# Patient Record
Sex: Male | Born: 1965 | Race: White | Hispanic: No | Marital: Married | Smoking: Former smoker
Health system: Southern US, Community
[De-identification: ages and names within clinical notes are randomized; demographics above are authoritative.]

## PROBLEM LIST (undated history)

## (undated) DIAGNOSIS — I10 Essential (primary) hypertension: Secondary | ICD-10-CM

## (undated) DIAGNOSIS — K219 Gastro-esophageal reflux disease without esophagitis: Secondary | ICD-10-CM

## (undated) DIAGNOSIS — E785 Hyperlipidemia, unspecified: Secondary | ICD-10-CM

## (undated) DIAGNOSIS — E119 Type 2 diabetes mellitus without complications: Secondary | ICD-10-CM

---

## 2008-09-14 HISTORY — PX: GASTRIC BYPASS: SHX52

## 2022-02-02 ENCOUNTER — Emergency Department (HOSPITAL_BASED_OUTPATIENT_CLINIC_OR_DEPARTMENT_OTHER): Admitting: Radiology

## 2022-02-02 ENCOUNTER — Encounter (HOSPITAL_BASED_OUTPATIENT_CLINIC_OR_DEPARTMENT_OTHER): Payer: Self-pay

## 2022-02-02 ENCOUNTER — Emergency Department (HOSPITAL_BASED_OUTPATIENT_CLINIC_OR_DEPARTMENT_OTHER)
Admission: EM | Admit: 2022-02-02 | Discharge: 2022-02-02 | Disposition: A | Discharge status: Home / Self Care | Attending: Emergency Medicine | Admitting: Emergency Medicine

## 2022-02-02 ENCOUNTER — Other Ambulatory Visit: Payer: Self-pay

## 2022-02-02 DIAGNOSIS — R0782 Intercostal pain: Secondary | ICD-10-CM | POA: Insufficient documentation

## 2022-02-02 DIAGNOSIS — R0781 Pleurodynia: Secondary | ICD-10-CM

## 2022-02-02 HISTORY — DX: Essential (primary) hypertension: I10

## 2022-02-02 HISTORY — DX: Gastro-esophageal reflux disease without esophagitis: K21.9

## 2022-02-02 HISTORY — DX: Type 2 diabetes mellitus without complications: E11.9

## 2022-02-02 HISTORY — DX: Hyperlipidemia, unspecified: E78.5

## 2022-02-02 NOTE — ED Provider Notes (Signed)
[facility] EMERGENCY DEPT Provider Note   CSN: [id number] Arrival date & time: 02/02/22  1332     History  Chief Complaint  Patient presents with   Kyle Hayes is a 56 y.o. male.  Patient presents ER chief complaint of left lateral rib pain.  He states that he fell approximately 4 feet while on a ladder about 10 days ago.  He said persistent pain on the left side.  This pain was worsened with increased activity in the last several days.  Denies any fevers or cough or vomiting or diarrhea.  Pain is made worse with specific movements he states.      Home Medications Prior to Admission medications   Not on File      Allergies    Codeine and Morphine    Review of Systems   Review of Systems  Constitutional:  Negative for fever.  HENT:  Negative for ear pain and sore throat.   Eyes:  Negative for pain.  Respiratory:  Negative for cough.   Cardiovascular:  Negative for chest pain.  Gastrointestinal:  Negative for abdominal pain.  Genitourinary:  Positive for flank pain.  Musculoskeletal:  Negative for back pain.  Skin:  Negative for color change and rash.  Neurological:  Negative for syncope.  All other systems reviewed and are negative.  Physical Exam Updated Vital Signs BP 140/89 (BP Location: Right Arm)   Pulse 88   Temp 98.7 F (37.1 C) (Oral)   Resp 20   Ht 5\' 8"  (1.727 m)   Wt 127 kg   SpO2 97%   BMI 42.57 kg/m  Physical Exam Constitutional:      Appearance: He is well-developed.  HENT:     Head: Normocephalic.     Nose: Nose normal.  Eyes:     Extraocular Movements: Extraocular movements intact.  Cardiovascular:     Rate and Rhythm: Normal rate.  Pulmonary:     Effort: Pulmonary effort is normal.  Abdominal:     Tenderness: There is no abdominal tenderness. There is no guarding or rebound.  Musculoskeletal:     Comments: Pain reproduced with twisting of his torso to the left side.  Otherwise no focal neurodeficit noted.   Skin:    Coloration: Skin is not jaundiced.  Neurological:     General: No focal deficit present.     Mental Status: He is alert. Mental status is at baseline.    ED Results / Procedures / Treatments   Labs (all labs ordered are listed, but only abnormal results are displayed) Labs Reviewed - No data to display  EKG None  Radiology DG Ribs Unilateral W/Chest Left  Result Date: 02/02/2022 CLINICAL DATA:  Left rib pain after a fall 2 weeks ago EXAM: LEFT RIBS AND CHEST - 3+ VIEW COMPARISON:  None Available. FINDINGS: Frontal view of the chest and three views of left-sided ribs. Images are degraded by patient body habitus. Frontal view of the chest demonstrates mild cardiomegaly. No pneumothorax. Extremely low lung volumes. Patchy left base airspace disease. No right and no definite left pleural effusion. Rib radiographs demonstrate a marker projecting at approximately the lateral left eighth rib. No underlying displaced fracture. IMPRESSION: Patient body habitus limitations as detailed above. No displaced rib fracture, pneumothorax, or significant pleural fluid. Left base airspace disease, favoring atelectasis. Cardiomegaly and low lung volumes. Electronically Signed   By: Jeronimo Greaves M.D.   On: 02/02/2022 14:54    Procedures Procedures  Medications Ordered in ED Medications - No data to display  ED Course/ Medical Decision Making/ A&P                           Medical Decision Making Amount and/or Complexity of Data Reviewed Radiology: ordered.   Chart review shows office visit for hyperlipidemia November 14, 2021.  Work-up here today includes x-rays of the ribs which were unremarkable for displaced fracture.  Difficult imaging per radiologist due to body habitus.  Differential included abdominal pathology, however he has no abdominal tenderness on exam no guarding or rebound noted.  Patient advised continue Tylenol Motrin as needed for pain.  Recommended outpatient follow-up  with his doctor within the week if pain persist.  Recommending he return back to the ER if he has difficulty breathing worsening symptoms or any additional concerns.        Final Clinical Impression(s) / ED Diagnoses Final diagnoses:  Rib pain on left side    Rx / DC Orders ED Discharge Orders     None         Luna Fuse, MD 02/02/22 1506

## 2022-02-02 NOTE — ED Triage Notes (Signed)
Patient here POV from Home.  Endorses falling on 01/22/2022 approximately 4 Feet onto Left Back/Torso. On 01/25/2022, he also endorses worsening Pain due to Sexual Activity.   No LOC. No Anticoagulants.  NAD Noted during Triage. A&Ox4. GCS 15. Ambulatory.

## 2022-02-02 NOTE — ED Notes (Addendum)
Fell on 01/22/22 approx 4 ft hitting back and left side of body. Pain 3/10 Alert and oriented NAD, normal gait

## 2022-02-02 NOTE — Discharge Instructions (Signed)
There is no evidence of a displaced fracture on x-ray.  This may mean that you contused your ribs, or had a hairline fracture or a fracture that is already started to heal.  Follow-up with your doctor this week.  Take Tylenol and Motrin as needed for pain.  Return back to the ER if you have worsening symptoms or any additional concerns.

## 2022-08-24 NOTE — Progress Notes (Signed)
Cardiology Office Note:    Date:  08/26/2022   ID:  Kyle Hayes, DOB 15-May-1966, MRN [id number]  PCP:  Rebecka Apley, NP  Cardiologist:  Jodelle Red, MD  Referring MD: Rebecka Apley, NP   CC: New patient evaluation for chest pain  History of Present Illness:    Kyle Hayes is a 56 y.o. male with a hx of hypertension, hyperlipidemia, GERD, type 2 diabetes, iron deficiency anemia, s/p Roux-en-Y gastric bypass (2010), depression, and morbid obesity, who is seen as a new consult at the request of Hemberg, Ruby Cola, NP for the evaluation and management of chest pain.  On 07/20/2022 he saw Sharon Seller, NP and reported left chest pain ongoing for about 7 months. Not worse with exertion, and not affected by aspirin. His EKG showed NSR and was similar to prior in 2021.  Previously had a fall from a ladder in 01/2022 and presented to the ED with left lateral costal pain. X-ray imaging of the ribs unremarkable for displaced fracture.   Chest pain: -Initial onset: Since his teens, he describes having a "stress spot" in his left chest that he can point to. His chest discomfort was always localized to this spot until more recently, as the area is now broader but still concentrated in the "stress spot." -Quality: Usually more of a discomfort than a pain. Not changed with applying pressure. A couple times he was close to presenting to the ED due to the severity. -Frequency: Typically dependent on stress. This year has been more frequent since his wife's heart attack.  -Duration: On Friday a few weeks ago he had a more severe episode of chest pain. Today he had "just a twinge" while out in the lobby, and some discomfort during the visit today which he attributes to the stressful discussions.  -Aggravating/alleviating factors: Stress. Feels worse when stress is worse and more frequent. He uses meditation to consciously try to relax. -Prior workup:  He was previously seen by [facility]  cardiology 10/2019 for chest pain. Stress test 2021 was negative for ischemia. Previous sleep study with no indication of sleep apnea. -Alcohol: Very rare.  -Tobacco: Former smoker when he was in [employer], about 1 ppd. He quit in 2010. -Comorbidities: Hyperlipidemia on 40 mg simvastatin for 20+ years. Previously he tried rosuvastatin but felt like he had the flu, so he returned to simvastatin. Hypertension on losartan/HCTZ. Diabetes. He endorses hypoglycemia; if doesn't eat well he will feel worse. Rare acid reflux at the end of the day. -Exercise level: Able to mow his yard and go walking daily. Chest discomfort at times, but still has been able to continue with activity. He completed a strenuous hike the other day at [facility] with no exacerbation of chest pain. Used to be greater than 400 lbs, then dropped to 200 lbs and developed an issue with absorption. He then suffered chronic headaches and diarrhea over years. He doubled up on anti-allergens and symptoms resolved. He states that he takes 16x the recommended amount of anti-allergens to maintain (takes medications 4 times a day on average). Currently he states his weight has stabilized, and he plans to reach a goal of 225-250 lbs. -Cardiac ROS: no shortness of breath, no PND, no orthopnea, no LE edema, no syncope -Family history:  Father and paternal grandmother died in their 104's. His father had CABG and other heart issues. Paternal uncle with carotid disease. His mother had myocarditis. He has siblings who have hypertension, hyperlipidemia, and diabetes.  Past Medical History:  Diagnosis Date   Diabetes (HCC)    GERD (gastroesophageal reflux disease)    Hyperlipidemia    Hypertension     Past Surgical History:  Procedure Laterality Date   GASTRIC BYPASS  2010    Current Medications: Current Outpatient Medications on File Prior to Visit  Medication Sig   acetaminophen (TYLENOL) 500 MG tablet Take 500 mg by mouth every 6 (six) hours  as needed for mild pain or moderate pain.   busPIRone (BUSPAR) 15 MG tablet Take 15 mg by mouth 2 (two) times daily.   cetirizine (ZYRTEC) 10 MG tablet Take 10 mg by mouth daily.   Cholecalciferol 125 MCG (5000 UT) TABS Take 2 tablets by mouth daily.   doxepin (SINEQUAN) 50 MG capsule Take 50-100 mg by mouth at bedtime.   EPINEPHrine 0.3 mg/0.3 mL IJ SOAJ injection Inject 0.3 mg into the muscle as needed.   ferrous sulfate 325 (65 FE) MG EC tablet Take 325 mg by mouth daily with breakfast.   FLUoxetine (PROZAC) 20 MG capsule Take 20 mg by mouth 2 (two) times daily.   fluticasone (FLONASE) 50 MCG/ACT nasal spray Place 1 spray into both nostrils daily.   losartan-hydrochlorothiazide (HYZAAR) 100-25 MG tablet Take 1 tablet by mouth daily.   metFORMIN (GLUCOPHAGE-XR) 500 MG 24 hr tablet Take 500 mg by mouth daily with breakfast.   olopatadine (PATANOL) 0.1 % ophthalmic solution Place 1 drop into both eyes as needed.   Omega-3 Fatty Acids (FISH OIL) 1000 MG CAPS Take 1 capsule by mouth 2 (two) times daily.   omeprazole (PRILOSEC) 20 MG capsule Take 20 mg by mouth 2 (two) times daily.   simvastatin (ZOCOR) 40 MG tablet Take 40 mg by mouth daily at 6 PM.   traZODone (DESYREL) 100 MG tablet Take 100 mg by mouth at bedtime. For sleep   No current facility-administered medications on file prior to visit.     Allergies:   Rosuvastatin, Codeine, and Morphine   Social History   Tobacco Use   Smoking status: Former    Types: Cigarettes   Smokeless tobacco: Never  Substance Use Topics   Alcohol use: Yes    Comment: Occasionally   Drug use: Never    Family History: Father and paternal grandmother died in their 94's. His father had CABG and other heart issues. Paternal uncle with carotid disease. His mother had myocarditis. He has siblings who have hypertension, hyperlipidemia, and diabetes.  ROS:   Please see the history of present illness.  Additional pertinent ROS: Constitutional: Negative  for chills, fever, night sweats, unintentional weight loss  HENT: Negative for ear pain and hearing loss.   Eyes: Negative for loss of vision and eye pain.  Respiratory: Negative for cough, sputum, wheezing.   Cardiovascular: See HPI. Gastrointestinal: Negative for abdominal pain, melena, and hematochezia.  Genitourinary: Negative for dysuria and hematuria.  Musculoskeletal: Negative for falls and myalgias.  Skin: Negative for itching. Positive for intermittent skin rash.  Neurological: Negative for focal weakness, focal sensory changes and loss of consciousness. Positive for stress. Endo/Heme/Allergies: Does not bruise/bleed easily.     EKGs/Labs/Other Studies Reviewed:    The following studies were reviewed today:  Stress Echo 11/27/2019  ([facility]): Left Ventricle: Systolic function is normal. EF: 60-65%.    Post-stress: Left ventricle cavity appears normal post-stress. Echo  contrast used to augment images.    Post-stress Impression: The study is negative for ischemia.   Patient achieved 10.10 METs.   EKG:  EKG is personally reviewed.   08/25/2022: nsr at 75 bpm  Recent Labs: No results found for requested labs within last 365 days.   Recent Lipid Panel No results found for: "CHOL", "TRIG", "HDL", "CHOLHDL", "VLDL", "LDLCALC", "LDLDIRECT"  Physical Exam:    VS:  BP 114/82 (BP Location: Right Arm, Patient Position: Sitting, Cuff Size: Large)   Pulse 75   Ht  (1.727 m)   Wt 273 lb 14.4 oz (124.2 kg)   BMI 41.65 kg/m     Wt Readings from Last 3 Encounters:  08/25/22 273 lb 14.4 oz (124.2 kg)  02/02/22 280 lb (127 kg)    GEN: Well nourished, well developed in no acute distress HEENT: Normal, moist mucous membranes NECK: No JVD CARDIAC: regular rhythm, normal S1 and S2, no rubs or gallops. No murmur. VASCULAR: Radial and DP pulses 2+ bilaterally. No carotid bruits RESPIRATORY:  Clear to auscultation without rales, wheezing or rhonchi  ABDOMEN: Soft, non-tender,  non-distended MUSCULOSKELETAL:  Ambulates independently SKIN: Warm and dry, no edema NEUROLOGIC:  Alert and oriented x 3. No focal neuro deficits noted. PSYCHIATRIC:  Normal affect    ASSESSMENT:    1. Chest pain of uncertain etiology   2. Essential hypertension   3. Mixed hyperlipidemia   4. Type 2 diabetes mellitus without complication, without long-term current use of insulin (HCC)   5. Cardiac risk counseling   6. Counseling on health promotion and disease prevention   7. History of Roux-en-Y gastric bypass   8. Precordial pain   9. Class 3 severe obesity due to excess calories with serious comorbidity and body mass index (BMI) of 40.0 to 44.9 in adult [facility])    PLAN:    Chest pain -discussed options today. Had normal stress echo in the past. As this has been largely atypical but going on for a long time, discussed cardiac PET for best resolution capability for evaluation of ischemia -reviewed red flag warning signs that need immediate medical attention  Hypertension -continue losartan-HCTZ  Mixed hyperlipidemia Type II diabetes Class 3 obesity History of gastric bypass surgery -on metformin. Discussed GLP1RA today, he has used in the past, didn't feel that it helped much. Would consider trying another agent such as Mounjaro but defer to his PCP. He is working with Ginette Otto weight loss, declines glp1 -on simvastatin. If cardiac PET suggests CAD, would intensify statin Lipids 07/17/22 Tchol 186, HDL 37, LDL 112, TG 209  Cardiac risk counseling and prevention recommendations: -recommend heart healthy/Mediterranean diet, with whole grains, fruits, vegetable, fish, lean meats, nuts, and olive oil. Limit salt. -recommend moderate walking, 3-5 times/week for 30-50 minutes each session. Aim for at least 150 minutes.week. Goal should be pace of 3 miles/hours, or walking 1.5 miles in 30 minutes -recommend avoidance of tobacco products. Avoid excess alcohol. -ASCVD risk score: The  10-year ASCVD risk score (Arnett DK, et al., 2019) is: 12%   Values used to calculate the score:     Age: 62 years     Sex: Male     Is Non-Hispanic African American: No     Diabetic: Yes     Tobacco smoker: No     Systolic Blood Pressure: 114 mmHg     Is BP treated: Yes     HDL Cholesterol: 37 mg/dL     Total Cholesterol: 186 mg/dL    Plan for follow up: 3 months, or sooner as needed.  Jodelle Red, MD, PhD, [facility]  [facility]  Medication Adjustments/Labs and Tests Ordered: Current medicines are reviewed at length with the patient today.  Concerns regarding medicines are outlined above.   Orders Placed This Encounter  Procedures   NM PET CT CARDIAC PERFUSION MULTI W/ABSOLUTE BLOODFLOW   Cardiac Stress Test: Informed Consent Details: Physician/Practitioner Attestation; Transcribe to consent form and obtain patient signature   EKG 12-Lead   No orders of the defined types were placed in this encounter.  Patient Instructions  Medication Instructions:  Your Physician recommend you continue on your current medication as directed.    *If you need a refill on your cardiac medications before your next appointment, please call your pharmacy*  Testing/Procedures: How to Prepare for Your Cardiac PET/CT Stress Test:  1. Please do not take these medications before your test:   Medications that may interfere with the cardiac pharmacological stress agent (ex. nitrates - including erectile dysfunction medications, isosorbide mononitrate or beta-blockers) the day of the exam. (Erectile dysfunction medication should be held for at least 72 hrs prior to test) Theophylline containing medications for 12 hours. Dipyridamole 48 hours prior to the test. Your remaining medications may be taken with water.  2. Nothing to eat or drink, except water, 3 hours prior to arrival time.   NO caffeine/decaffeinated products, or chocolate 12 hours prior to arrival.  3. NO  perfume, cologne or lotion  4. Total time is 1 to 2 hours; you may want to bring reading material for the waiting time.  5. [facility] 30 minutes early for your test.  [address]  [place], [place] [id number]  Diabetic Preparation:  Hold oral medications. You may take NPH and Lantus insulin. Do not take Humalog or Humulin R (Regular Insulin) the day of your test. Check blood sugars prior to leaving the house. If able to eat breakfast prior to 3 hour fasting, you may take all medications, including your insulin, Do not worry if you miss your breakfast dose of insulin - start at your next meal.  In preparation for your appointment, medication and supplies will be purchased.  Appointment availability is limited, so if you need to cancel or reschedule, please call the Radiology Department at [phone]  24 hours in advance to avoid a cancellation fee of $100.00  What to Expect After you Arrive:  Once you arrive and check in for your appointment, you will be taken to a preparation room within the Radiology Department.  A technologist or Nurse will obtain your medical history, verify that you are correctly prepped for the exam, and explain the procedure.  Afterwards,  an IV will be started in your arm and electrodes will be placed on your skin for EKG monitoring during the stress portion of the exam. Then you will be escorted to the PET/CT scanner.  There, staff will get you positioned on the scanner and obtain a blood pressure and EKG.  During the exam, you will continue to be connected to the EKG and blood pressure machines.  A small, safe amount of a radioactive tracer will be injected in your IV to obtain a series of pictures of your heart along with an injection of a stress agent.    After your Exam:  It is recommended that you eat a meal and drink a caffeinated beverage to counter act any effects of the stress agent.  Drink  plenty of fluids for the remainder of the day and urinate frequently for the first couple  of hours after the exam.  Your doctor will inform you of your test results within 7-10 business days.  For questions about your test or how to prepare for your test, please call: Rockwell Alexandria, Cardiac Imaging Nurse Navigator  Larey Brick, Cardiac Imaging Nurse Navigator Office: [phone]    Follow-Up: At [facility], you and your health needs are our priority.  As part of our continuing mission to provide you with exceptional heart care, we have created designated Provider Care Teams.  These Care Teams include your primary Cardiologist (physician) and Advanced Practice Providers (APPs -  Physician Assistants and Nurse Practitioners) who all work together to provide you with the care you need, when you need it.  We recommend signing up for the patient portal called "MyChart".  Sign up information is provided on this After Visit Summary.  MyChart is used to connect with patients for Virtual Visits (Telemedicine).  Patients are able to view lab/test results, encounter notes, upcoming appointments, etc.  Non-urgent messages can be sent to your provider as well.   To learn more about what you can do with MyChart, go to [email or web address].    Your next appointment:   3 month(s)  The format for your next appointment:   In Person  Provider:   Jodelle Red, MD       [facility] Stumpf,acting as a scribe for Jodelle Red, MD.,have documented all relevant documentation on the behalf of Jodelle Red, MD,as directed by  Jodelle Red, MD while in the presence of Jodelle Red, MD.  I, Jodelle Red, MD, have reviewed all documentation for this visit. The documentation on 08/26/22 for the exam, diagnosis, procedures, and orders are all accurate and complete.   Signed, Jodelle Red, MD PhD 08/26/2022 4:56 PM    [facility]

## 2022-08-25 ENCOUNTER — Ambulatory Visit (INDEPENDENT_AMBULATORY_CARE_PROVIDER_SITE_OTHER): Admitting: Cardiology

## 2022-08-25 ENCOUNTER — Encounter (HOSPITAL_BASED_OUTPATIENT_CLINIC_OR_DEPARTMENT_OTHER): Payer: Self-pay | Admitting: Cardiology

## 2022-08-25 VITALS — BP 114/82 | HR 75 | Ht 68.0 in | Wt 273.9 lb

## 2022-08-25 DIAGNOSIS — Z7189 Other specified counseling: Secondary | ICD-10-CM

## 2022-08-25 DIAGNOSIS — R072 Precordial pain: Secondary | ICD-10-CM | POA: Diagnosis not present

## 2022-08-25 DIAGNOSIS — E782 Mixed hyperlipidemia: Secondary | ICD-10-CM

## 2022-08-25 DIAGNOSIS — E119 Type 2 diabetes mellitus without complications: Secondary | ICD-10-CM | POA: Diagnosis not present

## 2022-08-25 DIAGNOSIS — I10 Essential (primary) hypertension: Secondary | ICD-10-CM

## 2022-08-25 DIAGNOSIS — Z9884 Bariatric surgery status: Secondary | ICD-10-CM

## 2022-08-25 DIAGNOSIS — E66813 Obesity, class 3: Secondary | ICD-10-CM

## 2022-08-25 DIAGNOSIS — Z6841 Body Mass Index (BMI) 40.0 and over, adult: Secondary | ICD-10-CM

## 2022-08-25 DIAGNOSIS — R079 Chest pain, unspecified: Secondary | ICD-10-CM

## 2022-08-25 NOTE — Patient Instructions (Signed)
Medication Instructions:  Your Physician recommend you continue on your current medication as directed.    *If you need a refill on your cardiac medications before your next appointment, please call your pharmacy*  Testing/Procedures: How to Prepare for Your Cardiac PET/CT Stress Test:  1. Please do not take these medications before your test:   Medications that may interfere with the cardiac pharmacological stress agent (ex. nitrates - including erectile dysfunction medications, isosorbide mononitrate or beta-blockers) the day of the exam. (Erectile dysfunction medication should be held for at least 72 hrs prior to test) Theophylline containing medications for 12 hours. Dipyridamole 48 hours prior to the test. Your remaining medications may be taken with water.  2. Nothing to eat or drink, except water, 3 hours prior to arrival time.   NO caffeine/decaffeinated products, or chocolate 12 hours prior to arrival.  3. NO perfume, cologne or lotion  4. Total time is 1 to 2 hours; you may want to bring reading material for the waiting time.  5. Please report to Admitting at the [facility] Main Entrance 30 minutes early for your test.  [address] [place], [place] [address]  Diabetic Preparation:  Hold oral medications. You may take NPH and Lantus insulin. Do not take Humalog or Humulin R (Regular Insulin) the day of your test. Check blood sugars prior to leaving the house. If able to eat breakfast prior to 3 hour fasting, you may take all medications, including your insulin, Do not worry if you miss your breakfast dose of insulin - start at your next meal.  In preparation for your appointment, medication and supplies will be purchased.  Appointment availability is limited, so if you need to cancel or reschedule, please call the Radiology Department at [phone]  24 hours in advance to avoid a cancellation fee of $100.00  What to Expect After you Arrive:  Once you  arrive and check in for your appointment, you will be taken to a preparation room within the Radiology Department.  A technologist or Nurse will obtain your medical history, verify that you are correctly prepped for the exam, and explain the procedure.  Afterwards,  an IV will be started in your arm and electrodes will be placed on your skin for EKG monitoring during the stress portion of the exam. Then you will be escorted to the PET/CT scanner.  There, staff will get you positioned on the scanner and obtain a blood pressure and EKG.  During the exam, you will continue to be connected to the EKG and blood pressure machines.  A small, safe amount of a radioactive tracer will be injected in your IV to obtain a series of pictures of your heart along with an injection of a stress agent.    After your Exam:  It is recommended that you eat a meal and drink a caffeinated beverage to counter act any effects of the stress agent.  Drink plenty of fluids for the remainder of the day and urinate frequently for the first couple of hours after the exam.  Your doctor will inform you of your test results within 7-10 business days.  For questions about your test or how to prepare for your test, please call: Rockwell Alexandria, Cardiac Imaging Nurse Navigator  Larey Brick, Cardiac Imaging Nurse Navigator Office: [phone]    Follow-Up: At [facility], you and your health needs are our priority.  As part of our continuing mission to provide you with exceptional heart care, we  have created designated Provider Care Teams.  These Care Teams include your primary Cardiologist (physician) and Advanced Practice Providers (APPs -  Physician Assistants and Nurse Practitioners) who all work together to provide you with the care you need, when you need it.  We recommend signing up for the patient portal called "MyChart".  Sign up information is provided on this After Visit Summary.  MyChart is used to connect with  patients for Virtual Visits (Telemedicine).  Patients are able to view lab/test results, encounter notes, upcoming appointments, etc.  Non-urgent messages can be sent to your provider as well.   To learn more about what you can do with MyChart, go to [email or web address].    Your next appointment:   3 month(s)  The format for your next appointment:   In Person  Provider:   Jodelle Red, MD

## 2022-08-26 ENCOUNTER — Encounter (HOSPITAL_BASED_OUTPATIENT_CLINIC_OR_DEPARTMENT_OTHER): Payer: Self-pay | Admitting: Cardiology

## 2022-09-25 ENCOUNTER — Telehealth (HOSPITAL_COMMUNITY): Payer: Self-pay | Admitting: *Deleted

## 2022-09-25 NOTE — Telephone Encounter (Signed)
Reaching out to patient to offer assistance regarding upcoming cardiac imaging study; pt verbalizes understanding of appt date/time, parking situation and where to check in, pre-test NPO status and verified current allergies; name and call back number provided for further questions should they arise  Gordy Clement RN Navigator Cardiac Imaging [facility] [phone] office [phone] cell  Patient aware to avoid caffeine 12 hours prior to his cardiac PET scan.

## 2022-09-29 ENCOUNTER — Encounter (HOSPITAL_COMMUNITY)
Admission: RE | Admit: 2022-09-29 | Discharge: 2022-09-29 | Disposition: A | Discharge status: Home / Self Care | Source: Ambulatory Visit | Attending: Cardiology | Admitting: Cardiology

## 2022-09-29 DIAGNOSIS — R072 Precordial pain: Secondary | ICD-10-CM | POA: Diagnosis present

## 2022-09-29 LAB — NM PET CT CARDIAC PERFUSION MULTI W/ABSOLUTE BLOODFLOW
MBFR: 4.43
Nuc Rest EF: 59 %
Nuc Stress EF: 69 %
Rest MBF: 0.6 ml/g/min
ST Depression (mm): 0 mm
Stress MBF: 2.66 ml/g/min
TID: 1.06

## 2022-09-29 MED ORDER — RUBIDIUM RB82 GENERATOR (RUBYFILL)
30.0000 | PACK | Freq: Once | INTRAVENOUS | Status: AC
  Administered 2022-09-29: 30.1 via INTRAVENOUS

## 2022-09-29 MED ORDER — REGADENOSON 0.4 MG/5ML IV SOLN
INTRAVENOUS | Status: AC
  Filled 2022-09-29: qty 5

## 2022-09-29 MED ORDER — REGADENOSON 0.4 MG/5ML IV SOLN
0.4000 mg | Freq: Once | INTRAVENOUS | Status: AC
  Administered 2022-09-29: 0.4 mg via INTRAVENOUS

## 2022-09-29 MED ORDER — RUBIDIUM RB82 GENERATOR (RUBYFILL)
30.0000 | PACK | Freq: Once | INTRAVENOUS | Status: AC
  Administered 2022-09-29: 30.2 via INTRAVENOUS

## 2022-11-24 ENCOUNTER — Encounter (HOSPITAL_BASED_OUTPATIENT_CLINIC_OR_DEPARTMENT_OTHER): Payer: Self-pay | Admitting: Cardiology

## 2022-11-24 ENCOUNTER — Ambulatory Visit (HOSPITAL_BASED_OUTPATIENT_CLINIC_OR_DEPARTMENT_OTHER): Admitting: Cardiology

## 2022-11-24 VITALS — BP 124/76 | HR 56 | Ht 68.0 in | Wt 280.0 lb

## 2022-11-24 DIAGNOSIS — Z712 Person consulting for explanation of examination or test findings: Secondary | ICD-10-CM

## 2022-11-24 DIAGNOSIS — Z6841 Body Mass Index (BMI) 40.0 and over, adult: Secondary | ICD-10-CM

## 2022-11-24 DIAGNOSIS — I2510 Atherosclerotic heart disease of native coronary artery without angina pectoris: Secondary | ICD-10-CM | POA: Diagnosis not present

## 2022-11-24 DIAGNOSIS — E782 Mixed hyperlipidemia: Secondary | ICD-10-CM | POA: Diagnosis not present

## 2022-11-24 DIAGNOSIS — Z7189 Other specified counseling: Secondary | ICD-10-CM

## 2022-11-24 DIAGNOSIS — E119 Type 2 diabetes mellitus without complications: Secondary | ICD-10-CM | POA: Diagnosis not present

## 2022-11-24 DIAGNOSIS — Z9884 Bariatric surgery status: Secondary | ICD-10-CM

## 2022-11-24 DIAGNOSIS — I10 Essential (primary) hypertension: Secondary | ICD-10-CM | POA: Diagnosis not present

## 2022-11-24 MED ORDER — ROSUVASTATIN CALCIUM 20 MG PO TABS: 20.0000 mg | ORAL_TABLET | Freq: Every day | ORAL | 3 refills | Status: DC

## 2022-11-24 NOTE — Patient Instructions (Signed)
Medication Instructions:  Change to Rosuvastatin (Crestor)  *If you need a refill on your cardiac medications before your next appointment, please call your pharmacy*   Lab Work: Lipids and Liver Function studies in 2 to 3 months  If you have labs (blood work) drawn today and your tests are completely normal, you will receive your results only by: [place] (if you have MyChart) OR A paper copy in the mail If you have any lab test that is abnormal or we need to change your treatment, we will call you to review the results.   Testing/Procedures: None   Follow-Up: At [facility], you and your health needs are our priority.  As part of our continuing mission to provide you with exceptional heart care, we have created designated Provider Care Teams.  These Care Teams include your primary Cardiologist (physician) and Advanced Practice Providers (APPs -  Physician Assistants and Nurse Practitioners) who all work together to provide you with the care you need, when you need it.  We recommend signing up for the patient portal called "MyChart".  Sign up information is provided on this After Visit Summary.  MyChart is used to connect with patients for Virtual Visits (Telemedicine).  Patients are able to view lab/test results, encounter notes, upcoming appointments, etc.  Non-urgent messages can be sent to your provider as well.   To learn more about what you can do with MyChart, go to [email or web address].    Your next appointment:   6 month(s)  Provider:   Buford Dresser, MD    Other Instructions None

## 2022-11-24 NOTE — Progress Notes (Signed)
Cardiology Office Note:    Date:  11/24/2022   ID:  Kyle Hayes, DOB 07-May-1966, MRN [id number]  PCP:  Bridget Hartshorn, NP  Cardiologist:  Buford Dresser, MD  Referring MD: Bridget Hartshorn, NP   CC: follow up  History of Present Illness:    Kyle Hayes is a 57 y.o. male with a hx of hypertension, hyperlipidemia, GERD, type 2 diabetes, iron deficiency anemia, s/p Roux-en-Y gastric bypass (2010), depression, and morbid obesity, who is seen for follow up today. I initially met him 08/25/2022 as a new consult at the request of Hemberg, Karie Schwalbe, NP for the evaluation and management of chest pain.  Tobacco: Former smoker when he was in [employer], about 1 ppd. He quit in 2010. Comorbidities: Hyperlipidemia on 40 mg simvastatin for 20+ years. Previously he tried rosuvastatin but felt like he had the flu, so he returned to simvastatin. Hypertension on losartan/HCTZ. Diabetes. He endorses hypoglycemia; if doesn't eat well he will feel worse. Rare acid reflux at the end of the day.  Today, the patient states that he has been doing well. He had tried rosuvastatin prior and was unable to tolerate it. We discussed using atorvastatin instead. His blood pressure initially in clinic was 144/70, on recheck it was 124/76.  He has been attempting to improve his diet recently and has also started supplementing for weight loss. He has been struggling with his mentality regarding weight loss after regaining 30 lbs he had lost earlier last year.  He denies any palpitations, chest pain, shortness of breath, or peripheral edema. No lightheadedness, headaches, syncope, orthopnea, or PND.  Past Medical History:  Diagnosis Date   Diabetes ([place])    GERD (gastroesophageal reflux disease)    Hyperlipidemia    Hypertension     Past Surgical History:  Procedure Laterality Date   GASTRIC BYPASS  2010    Current Medications: Current Outpatient Medications on File Prior to Visit  Medication Sig    acetaminophen (TYLENOL) 500 MG tablet Take 500 mg by mouth every 6 (six) hours as needed for mild pain or moderate pain.   busPIRone (BUSPAR) 15 MG tablet Take 15 mg by mouth 2 (two) times daily.   cetirizine (ZYRTEC) 10 MG tablet Take 10 mg by mouth daily.   Cholecalciferol 125 MCG (5000 UT) TABS Take 2 tablets by mouth daily.   doxepin (SINEQUAN) 50 MG capsule Take 50-100 mg by mouth at bedtime.   EPINEPHrine 0.3 mg/0.3 mL IJ SOAJ injection Inject 0.3 mg into the muscle as needed.   ferrous sulfate 325 (65 FE) MG EC tablet Take 325 mg by mouth daily with breakfast.   FLUoxetine (PROZAC) 20 MG capsule Take 20 mg by mouth 2 (two) times daily.   fluticasone (FLONASE) 50 MCG/ACT nasal spray Place 1 spray into both nostrils daily.   losartan-hydrochlorothiazide (HYZAAR) 100-25 MG tablet Take 1 tablet by mouth daily.   metFORMIN (GLUCOPHAGE-XR) 500 MG 24 hr tablet Take 500 mg by mouth daily with breakfast.   olopatadine (PATANOL) 0.1 % ophthalmic solution Place 1 drop into both eyes as needed.   Omega-3 Fatty Acids (FISH OIL) 1000 MG CAPS Take 1 capsule by mouth 2 (two) times daily.   omeprazole (PRILOSEC) 20 MG capsule Take 20 mg by mouth 2 (two) times daily.   simvastatin (ZOCOR) 40 MG tablet Take 40 mg by mouth daily at 6 PM.   traZODone (DESYREL) 100 MG tablet Take 100 mg by mouth at bedtime. For sleep   No  current facility-administered medications on file prior to visit.     Allergies:   Rosuvastatin, Codeine, and Morphine   Social History   Tobacco Use   Smoking status: Former    Types: Cigarettes   Smokeless tobacco: Never  Substance Use Topics   Alcohol use: Yes    Comment: Occasionally   Drug use: Never    Family History: Father and paternal grandmother died in their 54's. His father had CABG and other heart issues. Paternal uncle with carotid disease. His mother had myocarditis. He has siblings who have hypertension, hyperlipidemia, and diabetes.  ROS:   Please see the  history of present illness.   (+) Weight Gain Additional pertinent ROS otherwise unremarkable.  EKGs/Labs/Other Studies Reviewed:    The following studies were reviewed today: Cardiac PET 09/29/22   The study is normal. The study is low risk.   LV perfusion is normal.   Rest left ventricular function is normal. Rest EF: 59 %. Stress left ventricular function is normal. Stress EF: 69 %. End diastolic cavity size is normal. End systolic cavity size is normal.   Myocardial blood flow was computed to be 0.83ml/g/min at rest and 2.64ml/g/min at stress. Global myocardial blood flow reserve was 4.43 and was normal.   Coronary calcium was present on the attenuation correction CT images. Minimal coronary calcifications were present. Coronary calcifications were present in the left anterior descending artery distribution(s).  Stress Echo 11/27/2019  ([facility]): Left Ventricle: Systolic function is normal. EF: 60-65%.    Post-stress: Left ventricle cavity appears normal post-stress. Echo  contrast used to augment images.    Post-stress Impression: The study is negative for ischemia.   Patient achieved 10.10 METs.   EKG:  EKG is personally reviewed.   11/24/2022: not ordered today 08/25/2022: nsr at 75 bpm  Recent Labs: No results found for requested labs within last 365 days.   Recent Lipid Panel No results found for: "CHOL", "TRIG", "HDL", "CHOLHDL", "VLDL", "LDLCALC", "LDLDIRECT"  Physical Exam:    VS:  BP 124/76 (BP Location: Right Arm, Patient Position: Sitting, Cuff Size: Large)   Pulse (!) 56   Ht 5\' 8"  (1.727 m)   Wt 280 lb (127 kg)   BMI 42.57 kg/m     Wt Readings from Last 3 Encounters:  11/24/22 280 lb (127 kg)  08/25/22 273 lb 14.4 oz (124.2 kg)  02/02/22 280 lb (127 kg)    GEN: Well nourished, well developed in no acute distress HEENT: Normal, moist mucous membranes NECK: No JVD CARDIAC: regular rhythm, normal S1 and S2, no rubs or gallops. No murmur. VASCULAR: Radial  and DP pulses 2+ bilaterally. No carotid bruits RESPIRATORY:  Clear to auscultation without rales, wheezing or rhonchi  ABDOMEN: Soft, non-tender, non-distended MUSCULOSKELETAL:  Ambulates independently SKIN: Warm and dry, no edema NEUROLOGIC:  Alert and oriented x 3. No focal neuro deficits noted. PSYCHIATRIC:  Normal affect    ASSESSMENT:    1. Nonocclusive coronary atherosclerosis of native coronary artery   2. Essential hypertension   3. Mixed hyperlipidemia   4. Type 2 diabetes mellitus without complication, without long-term current use of insulin ([place])   5. History of Roux-en-Y gastric bypass   6. Class 3 severe obesity due to excess calories with serious comorbidity and body mass index (BMI) of 40.0 to 44.9 in adult ([place])   7. Encounter to discuss test results   8. Counseling on health promotion and disease prevention     PLAN:    Chest  pain -reviewed cardiac PET, very reassuring  Coronary calcification, consistent with nonobstructive CAD Aortic atherosclerosis Mixed hyperlipidemia Type II diabetes Class 3 obesity History of gastric bypass surgery -on metformin. He is working with [employer] weight loss, declines glp1 -will intensify simvastatin. He is willing to retrial rosuvastatin; if does not tolerate, try atorvastatin. Recheck labs in 2-3 mos Lipids 07/17/22 Tchol 186, HDL 37, LDL 112, TG 209. LDL goal ideally <55 but at least <70  Hypertension -continue losartan-HCTZ  Cardiac risk counseling and prevention recommendations: -recommend heart healthy/Mediterranean diet, with whole grains, fruits, vegetable, fish, lean meats, nuts, and olive oil. Limit salt. -recommend moderate walking, 3-5 times/week for 30-50 minutes each session. Aim for at least 150 minutes.week. Goal should be pace of 3 miles/hours, or walking 1.5 miles in 30 minutes -recommend avoidance of tobacco products. Avoid excess alcohol. -ASCVD risk score: The 10-year ASCVD risk score (Arnett DK, et  al., 2019) is: 13.8%   Values used to calculate the score:     Age: 34 years     Sex: Male     Is Non-Hispanic African American: No     Diabetic: Yes     Tobacco smoker: No     Systolic Blood Pressure: [id number] mmHg     Is BP treated: Yes     HDL Cholesterol: 36 mg/dL     Total Cholesterol: 213 mg/dL    Plan for follow up: 6 months  Buford Dresser, MD, PhD, [facility]    Medication Adjustments/Labs and Tests Ordered: Current medicines are reviewed at length with the patient today.  Concerns regarding medicines are outlined above.   Orders Placed This Encounter  Procedures   Lipid panel   Hepatic function panel   Meds ordered this encounter  Medications   rosuvastatin (CRESTOR) 20 MG tablet    Sig: Take 1 tablet (20 mg total) by mouth daily.    Dispense:  90 tablet    Refill:  3    Patient has some at home, please wait to fill until patient requests   Patient Instructions  Medication Instructions:  Change to Rosuvastatin (Crestor)  *If you need a refill on your cardiac medications before your next appointment, please call your pharmacy*   Lab Work: Lipids and Liver Function studies in 2 to 3 months  If you have labs (blood work) drawn today and your tests are completely normal, you will receive your results only by: [place] (if you have MyChart) OR A paper copy in the mail If you have any lab test that is abnormal or we need to change your treatment, we will call you to review the results.   Testing/Procedures: None   Follow-Up: At [facility], you and your health needs are our priority.  As part of our continuing mission to provide you with exceptional heart care, we have created designated Provider Care Teams.  These Care Teams include your primary Cardiologist (physician) and Advanced Practice Providers (APPs -  Physician Assistants and Nurse Practitioners) who all work together to provide you with the care you need,  when you need it.  We recommend signing up for the patient portal called "MyChart".  Sign up information is provided on this After Visit Summary.  MyChart is used to connect with patients for Virtual Visits (Telemedicine).  Patients are able to view lab/test results, encounter notes, upcoming appointments, etc.  Non-urgent messages can be sent to your provider as well.   To learn more about what  you can do with MyChart, go to [email or web address].    Your next appointment:   6 month(s)  Provider:   Buford Dresser, MD    Other Instructions None    I,Coren O'Brien,acting as a scribe for Buford Dresser, MD.,have documented all relevant documentation on the behalf of Buford Dresser, MD,as directed by  Buford Dresser, MD while in the presence of Buford Dresser, MD.  I, Buford Dresser, MD, have reviewed all documentation for this visit. The documentation on 12/08/22 for the exam, diagnosis, procedures, and orders are all accurate and complete.

## 2023-02-02 ENCOUNTER — Telehealth (HOSPITAL_BASED_OUTPATIENT_CLINIC_OR_DEPARTMENT_OTHER): Payer: Self-pay | Admitting: *Deleted

## 2023-02-02 DIAGNOSIS — E782 Mixed hyperlipidemia: Secondary | ICD-10-CM

## 2023-02-02 DIAGNOSIS — I2510 Atherosclerotic heart disease of native coronary artery without angina pectoris: Secondary | ICD-10-CM

## 2023-02-02 NOTE — Telephone Encounter (Signed)
Received fax from [facility] regarding regarding Rosuvastatin having allergy, please review  Last office note stated patient willing to try again  Left message to call back to discuss

## 2023-02-05 NOTE — Telephone Encounter (Signed)
Patient returned RN's call. 

## 2023-02-05 NOTE — Telephone Encounter (Signed)
Returned call to patient, he thought he had a reaction. He retried the medication and is having no issues, he is currently taking medication with no problems and would like to continue.

## 2023-02-10 MED ORDER — ROSUVASTATIN CALCIUM 20 MG PO TABS: 20.0000 mg | ORAL_TABLET | Freq: Every day | ORAL | 3 refills | Status: DC

## 2023-02-10 NOTE — Addendum Note (Signed)
Addended by: Regis Bill B on: 02/10/2023 03:42 PM   Modules accepted: Orders

## 2023-05-26 ENCOUNTER — Ambulatory Visit (HOSPITAL_BASED_OUTPATIENT_CLINIC_OR_DEPARTMENT_OTHER): Admitting: Cardiology

## 2023-06-23 LAB — LIPID PANEL
Chol/HDL Ratio: 4.7 {ratio} (ref 0.0–5.0)
Cholesterol, Total: 155 mg/dL (ref 100–199)
HDL: 33 mg/dL — ABNORMAL LOW (ref >39)
LDL Chol Calc (NIH): 93 mg/dL (ref 0–99)
Triglycerides: 166 mg/dL — ABNORMAL HIGH (ref 0–149)
VLDL Cholesterol Cal: 29 mg/dL (ref 5–40)

## 2023-06-23 LAB — HEPATIC FUNCTION PANEL
ALT: 30 [IU]/L (ref 0–44)
AST: 27 [IU]/L (ref 0–40)
Albumin: 4.5 g/dL (ref 3.8–4.9)
Alkaline Phosphatase: 78 [IU]/L (ref 44–121)
Bilirubin Total: 0.6 mg/dL (ref 0.0–1.2)
Bilirubin, Direct: 0.16 mg/dL (ref 0.00–0.40)
Total Protein: 6.7 g/dL (ref 6.0–8.5)

## 2023-06-28 ENCOUNTER — Telehealth (HOSPITAL_BASED_OUTPATIENT_CLINIC_OR_DEPARTMENT_OTHER): Payer: Self-pay

## 2023-06-28 NOTE — Telephone Encounter (Addendum)
Called patient with results, he is agreeable to potential options. Has appointment with Dr. Cristal Deer 10/16 and will discuss with her at that visit.    ----- Message from Alver Sorrow sent at 06/25/2023  5:07 PM EDT ----- Normal liver enzymes.  Triglycerides of 166 with goal being less than 150, however improved from prior of 209.  LDL (bad cholesterol) of 93 with goal being less than 70, however this is much improved from previous November labs with LDL 112.   We could either: Increase Rosuvastatin to 40mg  daily or add Zetia 10mg  daily. Either way, recommend repeat FLP/LFT in 3 months

## 2023-06-29 ENCOUNTER — Ambulatory Visit (HOSPITAL_BASED_OUTPATIENT_CLINIC_OR_DEPARTMENT_OTHER): Admitting: Cardiology

## 2023-06-29 NOTE — Progress Notes (Incomplete)
Cardiology Office Note:  .    Date:  06/29/2023  ID:  Kyle Hayes, DOB [birth year], MRN [id number] PCP: Rebecka Apley, NP  [facility] Providers Cardiologist:  Jodelle Red, MD { Click to update primary MD,subspecialty MD or APP then REFRESH:1}    History of Present Illness: .    Kyle Hayes is a 57 y.o. male with a hx of hypertension, hyperlipidemia, GERD, type 2 diabetes, iron deficiency anemia, s/p Roux-en-Y gastric bypass (2010), depression, and morbid obesity, who is seen for follow up today. I initially met him 08/25/2022 as a new consult at the request of Hemberg, Ruby Cola, NP for the evaluation and management of chest pain.   Tobacco: Former smoker when he was in [employer], about 1 ppd. He quit in 2010. Comorbidities: Hyperlipidemia on 40 mg simvastatin for 20+ years. Previously he tried rosuvastatin but felt like he had the flu, so he returned to simvastatin. Hypertension on losartan/HCTZ. Diabetes. He endorses hypoglycemia; if doesn't eat well he will feel worse. Rare acid reflux at the end of the day.   At his visit 11/2022, he was generally doing well. His blood pressure was initially 144/70 but improved to 124/76 on recheck. He was attempting to improve his diet and started supplementing for weight loss. He was struggling with his mentality regarding weight loss after regaining 30 lbs he had lost earlier the prior year. He had tried rosuvastatin prior and was unable to tolerate it. We discussed using atorvastatin instead. After discussion we planned to retrial 20 mg rosuvastatin, and would try atorvastatin if he did not tolerate this. Repeat lipid panel 06/2023 with triglycerides 166 (improved from 209) and LDL 93 (improved from 112). Recommended increasing rosuvastatin to 40 mg daily or adding Zetia 10 mg daily.  Today,  He denies any palpitations, chest pain, shortness of breath, peripheral edema, lightheadedness, headaches, syncope, orthopnea, or PND.  ROS:   Please see the history of present illness. ROS otherwise negative except as noted.  (+)  Studies Reviewed: .         Risk Assessment/Calculations:    {Does this patient have ATRIAL FIBRILLATION?:[phone]} No BP recorded.  {Refresh Note OR Click here to enter BP  :1}***       Physical Exam:    VS:  There were no vitals taken for this visit.   Wt Readings from Last 3 Encounters:  11/24/22 280 lb (127 kg)  08/25/22 273 lb 14.4 oz (124.2 kg)  02/02/22 280 lb (127 kg)    GEN: Well nourished, well developed in no acute distress HEENT: Normal, moist mucous membranes NECK: No JVD CARDIAC: regular rhythm, normal S1 and S2, no rubs or gallops. ***No murmur. VASCULAR: Radial and DP pulses 2+ bilaterally. No carotid bruits RESPIRATORY:  Clear to auscultation without rales, wheezing or rhonchi  ABDOMEN: Soft, non-tender, non-distended MUSCULOSKELETAL:  Ambulates independently SKIN: Warm and dry, no edema NEUROLOGIC:  Alert and oriented x 3. No focal neuro deficits noted. PSYCHIATRIC:  Normal affect   ASSESSMENT AND PLAN: .    ***Plan:  ***Prior Plan: Chest pain -reviewed cardiac PET, very reassuring   Coronary calcification, consistent with nonobstructive CAD Aortic atherosclerosis Mixed hyperlipidemia Type II diabetes Class 3 obesity History of gastric bypass surgery -on metformin. He is working with [employer] weight loss, declines glp1 -will intensify simvastatin. He is willing to retrial rosuvastatin; if does not tolerate, try atorvastatin. Recheck labs in 2-3 mos Lipids 07/17/22 Tchol 186, HDL 37, LDL 112, TG 209. LDL goal  ideally <55 but at least <70   Hypertension -continue losartan-HCTZ   Cardiac risk counseling and prevention recommendations: -recommend heart healthy/Mediterranean diet, with whole grains, fruits, vegetable, fish, lean meats, nuts, and olive oil. Limit salt. -recommend moderate walking, 3-5 times/week for 30-50 minutes each session. Aim for at least  150 minutes.week. Goal should be pace of 3 miles/hours, or walking 1.5 miles in 30 minutes -recommend avoidance of tobacco products. Avoid excess alcohol. -ASCVD risk score: The 10-year ASCVD risk score (Arnett DK, et al., 2019) is: 13.8%   Values used to calculate the score:     Age: 32 years     Sex: Male     Is Non-Hispanic African American: No     Diabetic: Yes     Tobacco smoker: No     Systolic Blood Pressure: 112 mmHg     Is BP treated: Yes     HDL Cholesterol: 36 mg/dL     Total Cholesterol: 213 mg/dL       {Are you ordering a CV Procedure (e.g. stress test, cath, DCCV, TEE, etc)?   Press F2        :[id number]}  Dispo: Follow-up in *** months, or sooner as needed.  I,Mathew Stumpf,acting as a Neurosurgeon for [employer], MD.,have documented all relevant documentation on the behalf of Jodelle Red, MD,as directed by  Jodelle Red, MD while in the presence of Jodelle Red, MD.  ***  Signed, Carlena Bjornstad

## 2023-06-30 ENCOUNTER — Other Ambulatory Visit (HOSPITAL_BASED_OUTPATIENT_CLINIC_OR_DEPARTMENT_OTHER): Payer: Self-pay

## 2023-06-30 ENCOUNTER — Ambulatory Visit (HOSPITAL_BASED_OUTPATIENT_CLINIC_OR_DEPARTMENT_OTHER): Admitting: Cardiology

## 2023-06-30 ENCOUNTER — Encounter (HOSPITAL_BASED_OUTPATIENT_CLINIC_OR_DEPARTMENT_OTHER): Payer: Self-pay | Admitting: Cardiology

## 2023-06-30 VITALS — BP 110/78 | HR 85 | Ht 67.0 in | Wt 277.6 lb

## 2023-06-30 DIAGNOSIS — I10 Essential (primary) hypertension: Secondary | ICD-10-CM

## 2023-06-30 DIAGNOSIS — Z6841 Body Mass Index (BMI) 40.0 and over, adult: Secondary | ICD-10-CM

## 2023-06-30 DIAGNOSIS — E782 Mixed hyperlipidemia: Secondary | ICD-10-CM | POA: Diagnosis not present

## 2023-06-30 DIAGNOSIS — Z9884 Bariatric surgery status: Secondary | ICD-10-CM

## 2023-06-30 DIAGNOSIS — E119 Type 2 diabetes mellitus without complications: Secondary | ICD-10-CM | POA: Diagnosis not present

## 2023-06-30 DIAGNOSIS — I2510 Atherosclerotic heart disease of native coronary artery without angina pectoris: Secondary | ICD-10-CM | POA: Diagnosis not present

## 2023-06-30 DIAGNOSIS — Z7189 Other specified counseling: Secondary | ICD-10-CM

## 2023-06-30 DIAGNOSIS — E66813 Obesity, class 3: Secondary | ICD-10-CM

## 2023-06-30 MED ORDER — EZETIMIBE 10 MG PO TABS
10.0000 mg | ORAL_TABLET | Freq: Every day | ORAL | 3 refills | Status: DC
  Filled 2023-06-30: qty 90, 90d supply, fill #0

## 2023-06-30 NOTE — Progress Notes (Signed)
Cardiology Office Note:  .   Date:  06/30/2023  ID:  Kyle Hayes, DOB 1966/09/10, MRN [id number] PCP: Rebecka Apley, NP  [facility] Providers Cardiologist:  Jodelle Red, MD {  History of Present Illness: .   Kyle Hayes is a 57 y.o. male with a hx of hypertension, hyperlipidemia, GERD, type 2 diabetes, iron deficiency anemia, s/p Roux-en-Y gastric bypass (2010), depression, ADHD and morbid obesity, who is seen for follow up today. I initially met him 08/25/2022 as a new consult at the request of Hemberg, Ruby Cola, NP for the evaluation and management of chest pain.   Tobacco: Former smoker when he was in [employer], about 1 ppd. He quit in 2010. Comorbidities: Hyperlipidemia on 40 mg simvastatin for 20+ years. Previously he tried rosuvastatin but felt like he had the flu, so he returned to simvastatin. Hypertension on losartan/HCTZ. Diabetes. He endorses hypoglycemia; if doesn't eat well he will feel worse. Rare acid reflux at the end of the day.  Today: Trying to find an ADHD med that is helpful to him. Strattera did not work for him. Recently diagnosed with walking pneumonia two days ago.  Retried rosuvastatin, tolerating well for the last 4-5 months. Reviewed labs today. LDL 93, down from 112. Discussed options to get to goal. He is willing to try zetia, discussed today. His exercise is great, working on dietary changes.   Had a genetic methylation test, trying to figure out what to do with the results.   ROS: Denies chest pain, shortness of breath at rest or with normal exertion. No PND, orthopnea, LE edema or unexpected weight gain. No syncope or palpitations. ROS otherwise negative except as noted.   Studies Reviewed: Marland Kitchen    EKG:  EKG Interpretation Date/Time:  Wednesday June 30 2023 15:01:52 EDT Ventricular Rate:  85 PR Interval:  192 QRS Duration:  90 QT Interval:  400 QTC Calculation: 476 R Axis:   -7  Text Interpretation: Normal sinus rhythm Low  voltage QRS Nonspecific T wave abnormality Confirmed by Jodelle Red [phone]) on 06/30/2023 5:11:26 PM    Physical Exam:   VS:  BP 110/78 (BP Location: Left Arm, Patient Position: Sitting, Cuff Size: Normal)   Pulse 85   Ht 5\' 7"  (1.702 m)   Wt 277 lb 9.6 oz (125.9 kg)   SpO2 94%   BMI 43.48 kg/m    Wt Readings from Last 3 Encounters:  06/30/23 277 lb 9.6 oz (125.9 kg)  11/24/22 280 lb (127 kg)  08/25/22 273 lb 14.4 oz (124.2 kg)    GEN: Well nourished, well developed in no acute distress HEENT: Normal, moist mucous membranes NECK: No JVD CARDIAC: regular rhythm, normal S1 and S2, no rubs or gallops. No murmur. VASCULAR: Radial and DP pulses 2+ bilaterally. No carotid bruits RESPIRATORY:  Clear to auscultation without rales, wheezing or rhonchi  ABDOMEN: Soft, non-tender, non-distended MUSCULOSKELETAL:  Ambulates independently SKIN: Warm and dry, no edema NEUROLOGIC:  Alert and oriented x 3. No focal neuro deficits noted. PSYCHIATRIC:  Normal affect    ASSESSMENT AND PLAN: .    Chest pain -had cardiac PET, very reassuring   Coronary calcification, consistent with nonobstructive CAD Aortic atherosclerosis Mixed hyperlipidemia Type II diabetes Class 3 obesity, BMI 43 History of gastric bypass surgery -on metformin. He is working with Ginette Otto weight loss, declines glp1 -tolerating rosuvastatin 20 mg daily Lipids 07/17/22 Tchol 186, HDL 37, LDL 112, TG 209. LDL goal ideally <55 but at least <70 Lipids 06/22/23  Tchol 155, HDL 33, LDL 93, TG 166 on 20 mg rosuvastatin daily -discussed options, will add ezetimibe today   Hypertension -continue losartan-hydrochlorothiazide  He is interested in [employer], given information today  CV risk counseling and prevention -recommend heart healthy/Mediterranean diet, with whole grains, fruits, vegetable, fish, lean meats, nuts, and olive oil. Limit salt. -recommend moderate walking, 3-5 times/week for 30-50 minutes  each session. Aim for at least 150 minutes.week. Goal should be pace of 3 miles/hours, or walking 1.5 miles in 30 minutes -recommend avoidance of tobacco products. Avoid excess alcohol.  Dispo: 6 mos  Signed, Jodelle Red, MD   Jodelle Red, MD, PhD, [facility]  [facility]  [place]  Heart & Vascular at [facility] at [facility] [address], [address] [place], [place] [address] [phone]

## 2023-06-30 NOTE — Patient Instructions (Addendum)
   Lab Work: FASTING LIPID IN 3 MONTHS DUE MID JANUARY 2025 If you have labs (blood work) drawn today and your tests are completely normal, you will receive your results only by: MyChart Message (if you have MyChart) OR A paper copy in the mail If you have any lab test that is abnormal or we need to change your treatment, we will call you to review the results.   Testing/Procedures: NONE   Follow-Up: At [facility], you and your health needs are our priority.  As part of our continuing mission to provide you with exceptional heart care, we have created designated Provider Care Teams.  These Care Teams include your primary Cardiologist (physician) and Advanced Practice Providers (APPs -  Physician Assistants and Nurse Practitioners) who all work together to provide you with the care you need, when you need it.  We recommend signing up for the patient portal called "MyChart".  Sign up information is provided on this After Visit Summary.  MyChart is used to connect with patients for Virtual Visits (Telemedicine).  Patients are able to view lab/test results, encounter notes, upcoming appointments, etc.  Non-urgent messages can be sent to your provider as well.   To learn more about what you can do with MyChart, go to [email or web address].    Your next appointment:   6 month(s)  Provider:   Jodelle Red, MD    Other Instructions  [email or web address]  GeneConnect is a community health research program brought to you by [facility]. The program helps you understand how your DNA impacts your health and aims to help improve access to more personalized health care, while supporting new research discoveries for our community.  There is no cost to participate, and health insurance is not required.  Participation is voluntary and signing up is simple. If you choose to participate, you and your doctor will receive  confidential results about your genetic risk for certain cancers and a cause of heart disease, as well as your regional ancestry and genetic trait related to your health.  The program will also develop a secure and privacy-protected genetic and research database that, over time, will help researchers learn what may cause certain diseases, how to treat them more effectively, and help improve the standard of health care for all.  Research Program Benefits Gaining a better understanding of your genetic health risks can help you and your doctor personalize your health care and plan for a healthier future. All participants will learn about their inherited risk for:  Common cancers: Hereditary breast and ovarian cancer and the most common cause of hereditary colorectal (colon) cancer (Lynch syndrome). Heart disease: Hereditary high cholesterol (also known as familial hypercholesterolemia). If you find you have an inherited risk for any above conditions, there are immediate actions you can take in consultation with your doctor to substantially reduce your risk.  You also have the opportunity to learn about your ancestry and other inherited traits like caffeine sensitivity, sleep patterns and more!

## 2023-07-06 ENCOUNTER — Other Ambulatory Visit: Payer: Self-pay | Admitting: Medical Genetics

## 2023-07-06 DIAGNOSIS — Z006 Encounter for examination for normal comparison and control in clinical research program: Secondary | ICD-10-CM

## 2023-07-16 ENCOUNTER — Other Ambulatory Visit (HOSPITAL_COMMUNITY)
Admission: RE | Admit: 2023-07-16 | Discharge: 2023-07-16 | Disposition: A | Discharge status: Home / Self Care | Source: Ambulatory Visit | Attending: Oncology | Admitting: Oncology

## 2023-07-16 DIAGNOSIS — Z006 Encounter for examination for normal comparison and control in clinical research program: Secondary | ICD-10-CM | POA: Insufficient documentation

## 2023-07-24 LAB — HELIX MOLECULAR SCREEN: Genetic Analysis Overall Interpretation: NEGATIVE

## 2023-07-24 LAB — GENECONNECT MOLECULAR SCREEN

## 2023-08-31 ENCOUNTER — Other Ambulatory Visit (HOSPITAL_BASED_OUTPATIENT_CLINIC_OR_DEPARTMENT_OTHER): Payer: Self-pay

## 2023-08-31 DIAGNOSIS — E782 Mixed hyperlipidemia: Secondary | ICD-10-CM

## 2023-08-31 MED ORDER — EZETIMIBE 10 MG PO TABS: 10.0000 mg | ORAL_TABLET | Freq: Every day | ORAL | 3 refills | Status: DC

## 2023-08-31 NOTE — Progress Notes (Signed)
Rx refill sent to preferred pharmacy. 

## 2023-11-20 IMAGING — DX DG RIBS W/ CHEST 3+V*L*
4 series · 4 of 4 positions shown · non-contrast
Comparison: None Available.

CLINICAL DATA: Left rib pain after a fall 2 weeks ago

EXAM:
LEFT RIBS AND CHEST - 3+ VIEW

[chest pa]
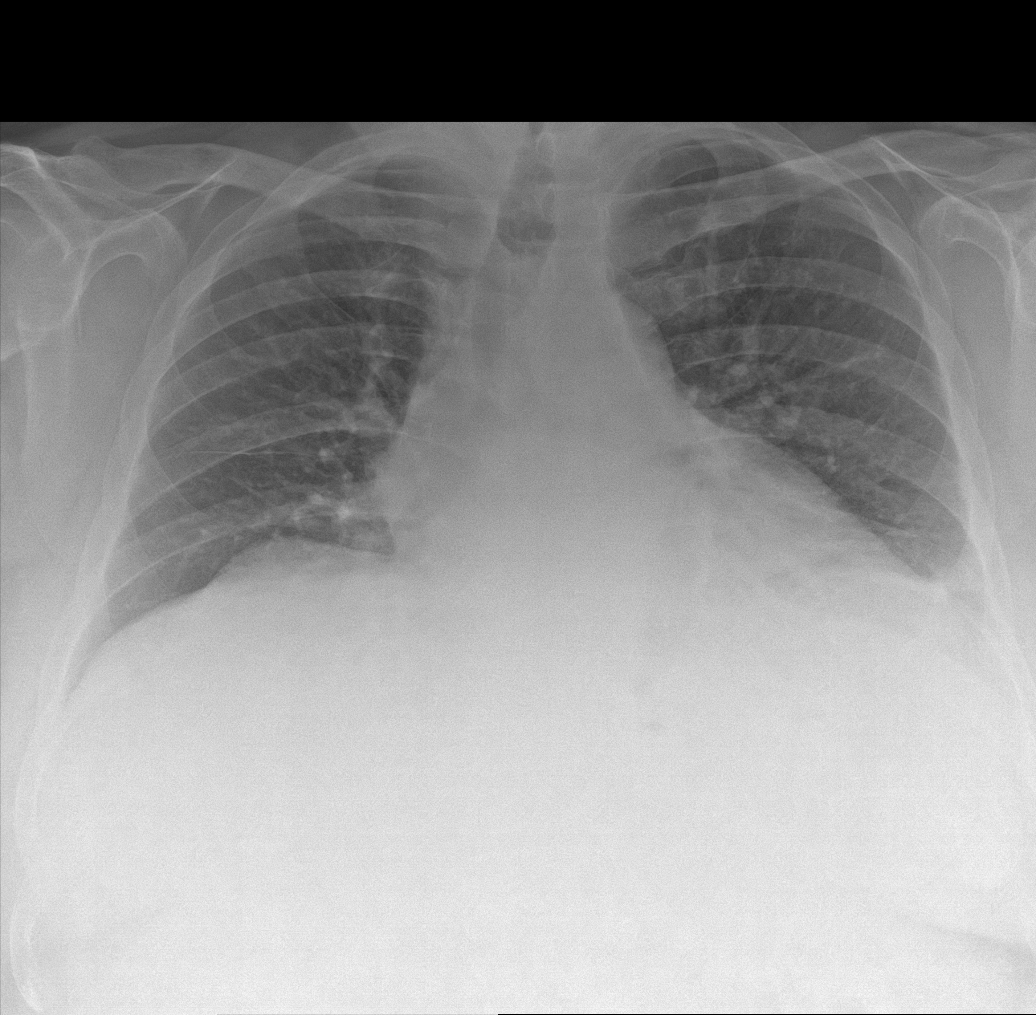

[rib pa (1 of 2)]
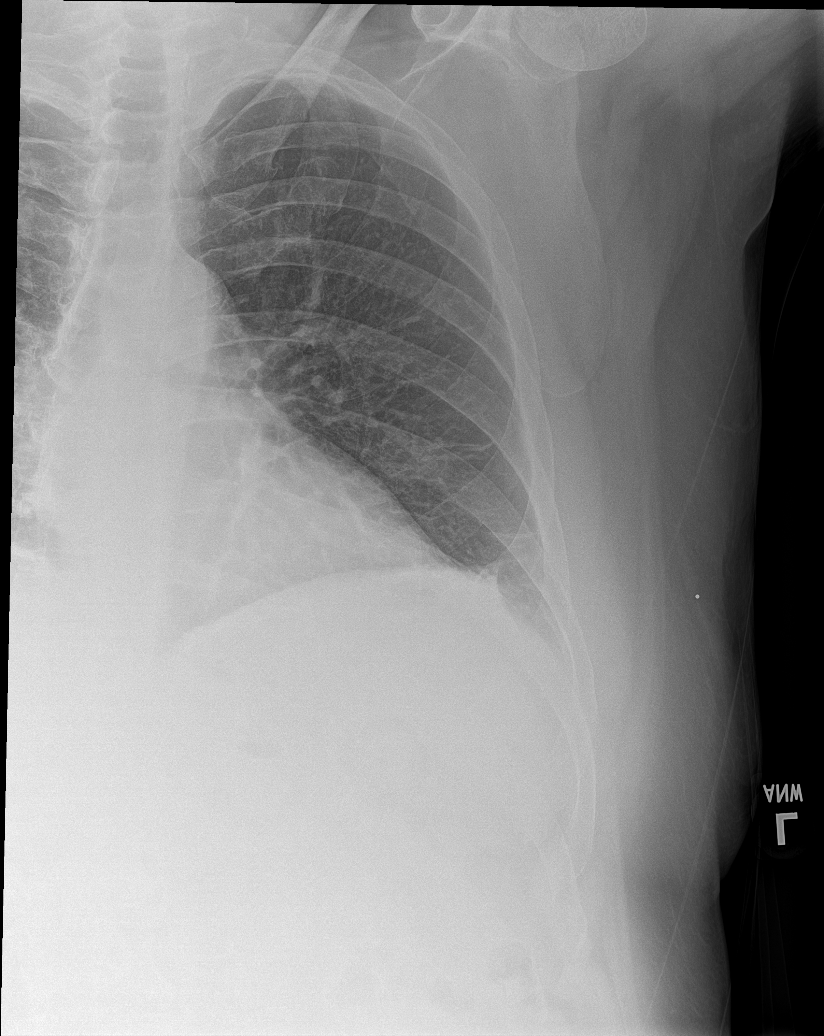

[rib pa (2 of 2)]
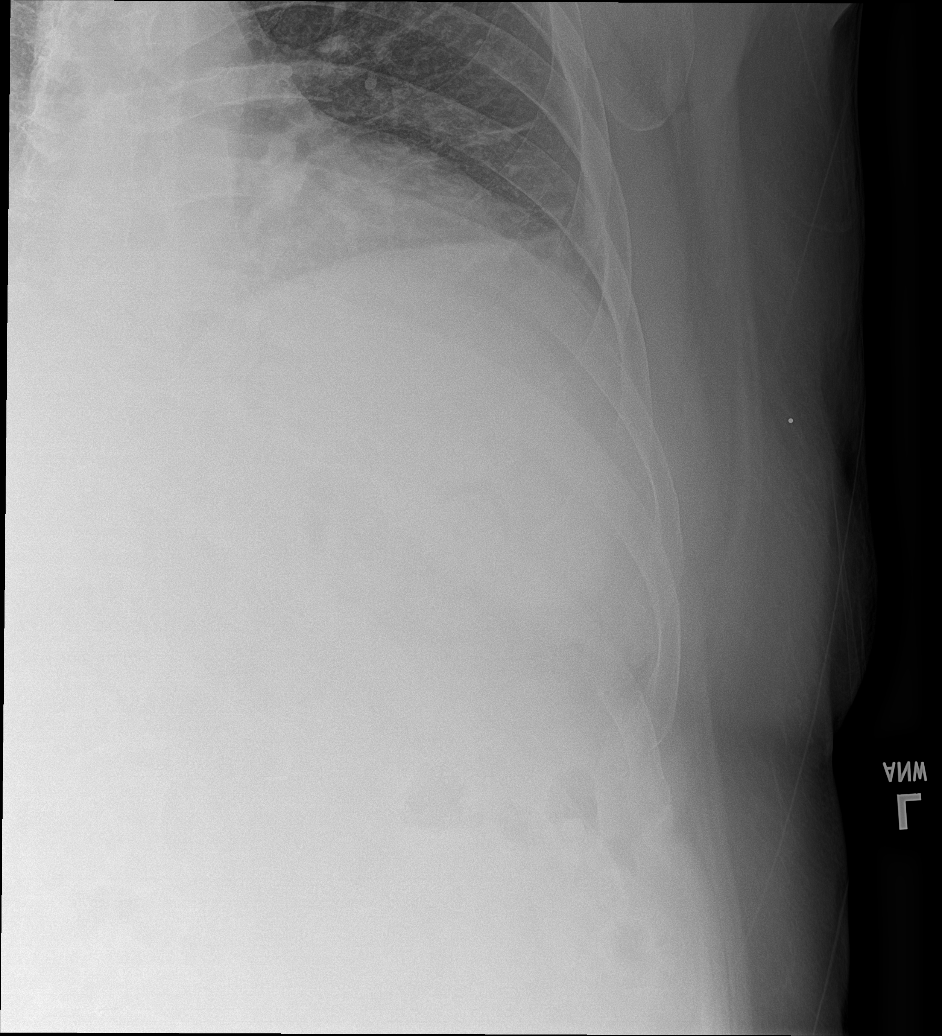

[rib obl]
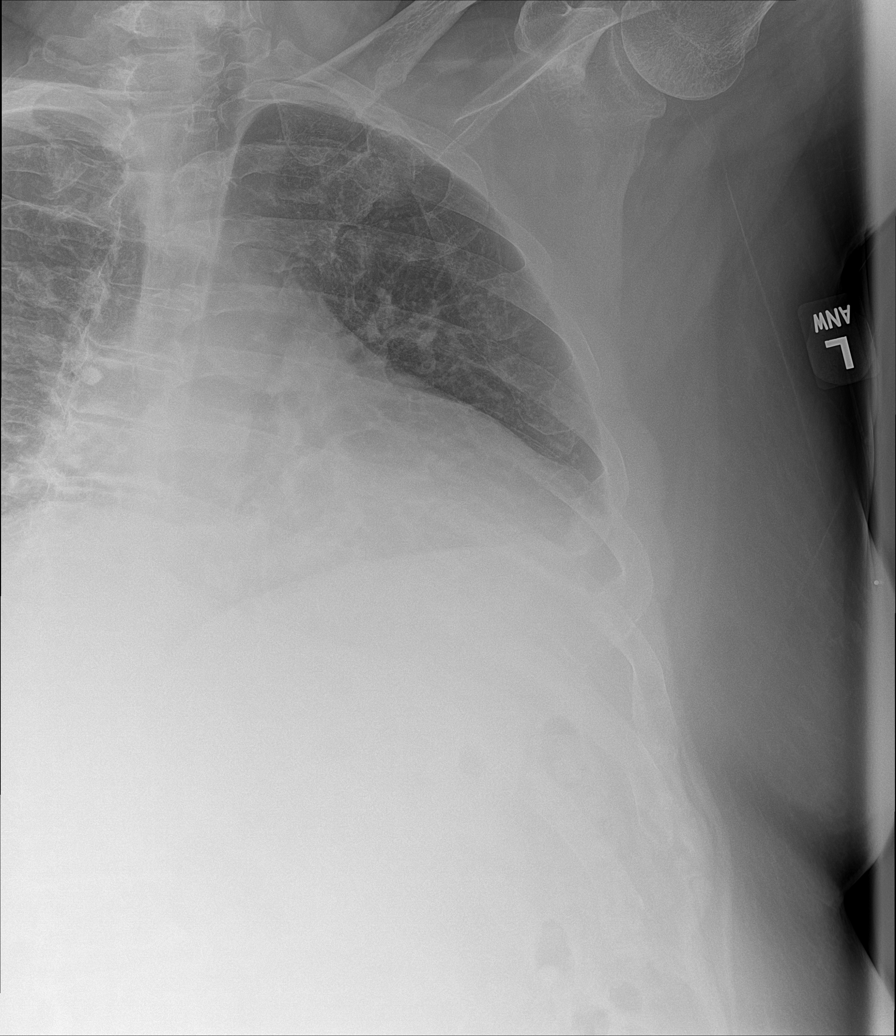

[4 of 4 positions shown; findings below may reference images not displayed]

FINDINGS: Frontal view of the chest and three views of left-sided ribs. Images
are degraded by patient body habitus. Frontal view of the chest
demonstrates mild cardiomegaly. No pneumothorax. Extremely low lung
volumes. Patchy left base airspace disease. No right and no definite
left pleural effusion.

Rib radiographs demonstrate a marker projecting at approximately the
lateral left eighth rib. No underlying displaced fracture.
IMPRESSION: Patient body habitus limitations as detailed above.

No displaced rib fracture, pneumothorax, or significant pleural
fluid.

Left base airspace disease, favoring atelectasis.

Cardiomegaly and low lung volumes.

## 2024-01-17 ENCOUNTER — Other Ambulatory Visit (HOSPITAL_BASED_OUTPATIENT_CLINIC_OR_DEPARTMENT_OTHER): Payer: Self-pay | Admitting: Cardiology

## 2024-01-17 DIAGNOSIS — E782 Mixed hyperlipidemia: Secondary | ICD-10-CM

## 2024-01-17 DIAGNOSIS — I2510 Atherosclerotic heart disease of native coronary artery without angina pectoris: Secondary | ICD-10-CM

## 2024-06-01 ENCOUNTER — Ambulatory Visit (INDEPENDENT_AMBULATORY_CARE_PROVIDER_SITE_OTHER): Admitting: Cardiology

## 2024-06-01 ENCOUNTER — Encounter (HOSPITAL_BASED_OUTPATIENT_CLINIC_OR_DEPARTMENT_OTHER): Payer: Self-pay | Admitting: Cardiology

## 2024-06-01 VITALS — BP 108/72 | HR 90 | Ht 67.0 in | Wt 276.0 lb

## 2024-06-01 DIAGNOSIS — E66813 Obesity, class 3: Secondary | ICD-10-CM

## 2024-06-01 DIAGNOSIS — I2510 Atherosclerotic heart disease of native coronary artery without angina pectoris: Secondary | ICD-10-CM | POA: Diagnosis not present

## 2024-06-01 DIAGNOSIS — Z6841 Body Mass Index (BMI) 40.0 and over, adult: Secondary | ICD-10-CM

## 2024-06-01 DIAGNOSIS — E782 Mixed hyperlipidemia: Secondary | ICD-10-CM

## 2024-06-01 DIAGNOSIS — I10 Essential (primary) hypertension: Secondary | ICD-10-CM

## 2024-06-01 NOTE — Progress Notes (Signed)
 Cardiology Office Note:  .   Date:  06/01/2024  ID:  Kyle Hayes, DOB 1966-04-28, MRN [id number] PCP: Baird Comer GAILS, NP  [facility] Providers Cardiologist:  Shelda Bruckner, MD {  History of Present Illness: .   Kyle Hayes is a 58 y.o. male with a hx of hypertension, hyperlipidemia, GERD, type 2 diabetes, iron deficiency anemia, s/p Roux-en-Y gastric bypass (2010), depression, ADHD and morbid obesity, who is seen for follow up today. I initially met him 08/25/2022 as a new consult at the request of Hemberg, Comer GAILS, NP for the evaluation and management of chest pain.   Tobacco: Former smoker when he was in [employer], about 1 ppd. He quit in 2010. Comorbidities: Hyperlipidemia on 40 mg simvastatin for 20+ years. Previously he tried rosuvastatin  but felt like he had the flu, so he returned to simvastatin. Hypertension on losartan/HCTZ. Diabetes. He endorses hypoglycemia; if doesn't eat well he will feel worse. Rare acid reflux at the end of the day.  Today: Overall doing well. Has stayed active, with heat in the summer had to work to hydrate. Now that weather is better, he is able to be active without limitations. He is mostly retired but stays active with projects.  Blood pressure is well controlled.   ROS: Denies chest pain, shortness of breath at rest or with normal exertion. No PND, orthopnea, LE edema or unexpected weight gain. No syncope or palpitations. ROS otherwise negative except as noted.   Studies Reviewed: SABRA    EKG:       Physical Exam:   VS:  BP 108/72 (BP Location: Left Arm, Patient Position: Sitting)   Pulse 90   Ht 5' 7 (1.702 m)   Wt 276 lb (125.2 kg)   SpO2 95%   BMI 43.23 kg/m    Wt Readings from Last 3 Encounters:  06/01/24 276 lb (125.2 kg)  06/30/23 277 lb 9.6 oz (125.9 kg)  11/24/22 280 lb (127 kg)    GEN: Well nourished, well developed in no acute distress HEENT: Normal, moist mucous membranes NECK: No JVD CARDIAC: regular rhythm,  normal S1 and S2, no rubs or gallops. No murmur. VASCULAR: Radial and DP pulses 2+ bilaterally. No carotid bruits RESPIRATORY:  Clear to auscultation without rales, wheezing or rhonchi  ABDOMEN: Soft, non-tender, non-distended MUSCULOSKELETAL:  Ambulates independently SKIN: Warm and dry, no edema NEUROLOGIC:  Alert and oriented x 3. No focal neuro deficits noted. PSYCHIATRIC:  Normal affect    ASSESSMENT AND PLAN: .     Coronary calcification, consistent with nonobstructive CAD Aortic atherosclerosis Mixed hyperlipidemia Type II diabetes Class 3 obesity, BMI 43 History of gastric bypass surgery -on metformin. We have discussed GLP1, he declines -tolerating rosuvastatin  20 mg daily Lipids 07/17/22 Tchol 186, HDL 37, LDL 112, TG 209. LDL goal ideally <55 but at least <70 Lipids 06/22/23 Tchol 155, HDL 33, LDL 93, TG 166 on 20 mg rosuvastatin  daily -added ezetimibe  at last visit, has not completed recheck, reordered today -had cardiac PET, very reassuring   Hypertension -continue losartan-hydrochlorothiazide -well controlled today  CV risk counseling and prevention -recommend heart healthy/Mediterranean diet, with whole grains, fruits, vegetable, fish, lean meats, nuts, and olive oil. Limit salt. -recommend moderate walking, 3-5 times/week for 30-50 minutes each session. Aim for at least 150 minutes.week. Goal should be pace of 3 miles/hours, or walking 1.5 miles in 30 minutes -recommend avoidance of tobacco products. Avoid excess alcohol.  Dispo: 6 mos  Signed, Shelda Bruckner, MD  Shelda Bruckner, MD, PhD, [facility]  [facility]  [place]  Heart & Vascular at [facility] at [facility] [address] [place], [place] [address] [phone]

## 2024-06-01 NOTE — Patient Instructions (Signed)
 Medication Instructions:  No changes today *If you need a refill on your cardiac medications before your next appointment, please call your pharmacy*  Lab Work: Return for fasting blood work (lipid panel)  If you have labs (blood work) drawn today and your tests are completely normal, you will receive your results only by: MyChart Message (if you have MyChart) OR A paper copy in the mail If you have any lab test that is abnormal or we need to change your treatment, we will call you to review the results.  Testing/Procedures: none  Follow-Up: At [facility], you and your health needs are our priority.  As part of our continuing mission to provide you with exceptional heart care, our providers are all part of one team.  This team includes your primary Cardiologist (physician) and Advanced Practice Providers or APPs (Physician Assistants and Nurse Practitioners) who all work together to provide you with the care you need, when you need it.  Your next appointment:   6 month(s)  Provider:   Shelda Bruckner, MD

## 2024-06-03 LAB — LIPID PANEL
Chol/HDL Ratio: 3.9 ratio (ref 0.0–5.0)
Cholesterol, Total: 126 mg/dL (ref 100–199)
HDL: 32 mg/dL — ABNORMAL LOW
LDL Chol Calc (NIH): 62 mg/dL (ref 0–99)
Triglycerides: 191 mg/dL — ABNORMAL HIGH (ref 0–149)
VLDL Cholesterol Cal: 32 mg/dL (ref 5–40)

## 2024-07-07 ENCOUNTER — Ambulatory Visit (HOSPITAL_BASED_OUTPATIENT_CLINIC_OR_DEPARTMENT_OTHER): Payer: Self-pay | Admitting: Cardiology

## 2024-07-12 ENCOUNTER — Ambulatory Visit (HOSPITAL_BASED_OUTPATIENT_CLINIC_OR_DEPARTMENT_OTHER): Admitting: Cardiology

## 2024-07-17 ENCOUNTER — Other Ambulatory Visit (HOSPITAL_BASED_OUTPATIENT_CLINIC_OR_DEPARTMENT_OTHER): Payer: Self-pay | Admitting: Cardiology

## 2024-07-17 DIAGNOSIS — E782 Mixed hyperlipidemia: Secondary | ICD-10-CM

## 2024-07-17 DIAGNOSIS — I2510 Atherosclerotic heart disease of native coronary artery without angina pectoris: Secondary | ICD-10-CM

## 2024-07-25 ENCOUNTER — Other Ambulatory Visit (HOSPITAL_BASED_OUTPATIENT_CLINIC_OR_DEPARTMENT_OTHER): Payer: Self-pay | Admitting: Cardiology

## 2024-07-25 DIAGNOSIS — E782 Mixed hyperlipidemia: Secondary | ICD-10-CM
